# Patient Record
Sex: Female | Born: 1970 | Race: Black or African American | Hispanic: No | Marital: Single | Smoking: Current every day smoker
Health system: Southern US, Community
[De-identification: ages and names within clinical notes are randomized; demographics above are authoritative.]

## PROBLEM LIST (undated history)

## (undated) HISTORY — PX: TUBAL LIGATION: SHX77

---

## 1998-04-11 ENCOUNTER — Emergency Department (HOSPITAL_COMMUNITY): Admission: EM | Admit: 1998-04-11 | Discharge: 1998-04-11 | Payer: Self-pay | Admitting: Emergency Medicine

## 1998-05-23 ENCOUNTER — Emergency Department (HOSPITAL_COMMUNITY): Admission: EM | Admit: 1998-05-23 | Discharge: 1998-05-23 | Payer: Self-pay | Admitting: Emergency Medicine

## 1998-05-24 ENCOUNTER — Encounter: Payer: Self-pay | Admitting: Emergency Medicine

## 2000-02-10 ENCOUNTER — Emergency Department (HOSPITAL_COMMUNITY): Admission: EM | Admit: 2000-02-10 | Discharge: 2000-02-10 | Payer: Self-pay | Admitting: Emergency Medicine

## 2000-08-26 ENCOUNTER — Emergency Department (HOSPITAL_COMMUNITY): Admission: EM | Admit: 2000-08-26 | Discharge: 2000-08-26 | Payer: Self-pay | Admitting: Emergency Medicine

## 2001-07-28 ENCOUNTER — Emergency Department (HOSPITAL_COMMUNITY): Admission: EM | Admit: 2001-07-28 | Discharge: 2001-07-28 | Payer: Self-pay | Admitting: *Deleted

## 2003-01-23 ENCOUNTER — Emergency Department (HOSPITAL_COMMUNITY): Admission: EM | Admit: 2003-01-23 | Discharge: 2003-01-23 | Payer: Self-pay

## 2003-08-31 ENCOUNTER — Emergency Department (HOSPITAL_COMMUNITY): Admission: AD | Admit: 2003-08-31 | Discharge: 2003-08-31 | Payer: Self-pay | Admitting: Family Medicine

## 2003-09-29 ENCOUNTER — Emergency Department (HOSPITAL_COMMUNITY): Admission: EM | Admit: 2003-09-29 | Discharge: 2003-09-29 | Payer: Self-pay | Admitting: Emergency Medicine

## 2004-03-22 ENCOUNTER — Emergency Department (HOSPITAL_COMMUNITY): Admission: EM | Admit: 2004-03-22 | Discharge: 2004-03-22 | Payer: Self-pay | Admitting: Emergency Medicine

## 2005-02-04 ENCOUNTER — Emergency Department (HOSPITAL_COMMUNITY): Admission: EM | Admit: 2005-02-04 | Discharge: 2005-02-04 | Payer: Self-pay | Admitting: Emergency Medicine

## 2006-12-01 ENCOUNTER — Emergency Department (HOSPITAL_COMMUNITY): Admission: EM | Admit: 2006-12-01 | Discharge: 2006-12-01 | Payer: Self-pay | Admitting: Emergency Medicine

## 2007-08-20 ENCOUNTER — Emergency Department (HOSPITAL_COMMUNITY): Admission: EM | Admit: 2007-08-20 | Discharge: 2007-08-20 | Payer: Self-pay | Admitting: Family Medicine

## 2009-01-31 ENCOUNTER — Emergency Department (HOSPITAL_COMMUNITY): Admission: EM | Admit: 2009-01-31 | Discharge: 2009-01-31 | Payer: Self-pay | Admitting: Family Medicine

## 2009-11-04 ENCOUNTER — Emergency Department (HOSPITAL_COMMUNITY): Admission: EM | Admit: 2009-11-04 | Discharge: 2009-11-04 | Payer: Self-pay | Admitting: Emergency Medicine

## 2009-11-07 ENCOUNTER — Ambulatory Visit (HOSPITAL_COMMUNITY): Admission: RE | Admit: 2009-11-07 | Discharge: 2009-11-07 | Payer: Self-pay | Admitting: Obstetrics

## 2010-09-15 LAB — DIFFERENTIAL
Basophils Relative: 1 % (ref 0–1)
Eosinophils Absolute: 0.1 10*3/uL (ref 0.0–0.7)
Monocytes Absolute: 0.4 10*3/uL (ref 0.1–1.0)
Monocytes Relative: 9 % (ref 3–12)
Neutrophils Relative %: 37 % — ABNORMAL LOW (ref 43–77)

## 2010-09-15 LAB — CBC
MCHC: 34 g/dL (ref 30.0–36.0)
MCV: 84.9 fL (ref 78.0–100.0)
RBC: 4.68 MIL/uL (ref 3.87–5.11)

## 2011-05-12 IMAGING — CR DG HAND COMPLETE 3+V*R*
3 series · 3 of 3 positions shown · non-contrast
Comparison: None.

CLINICAL DATA: The patient woke up with soft tissue swelling in the
right hand.  Pain is in the vicinity of the thumb.

RIGHT HAND - COMPLETE 3+ VIEW

[view not recorded (1 of 3)]
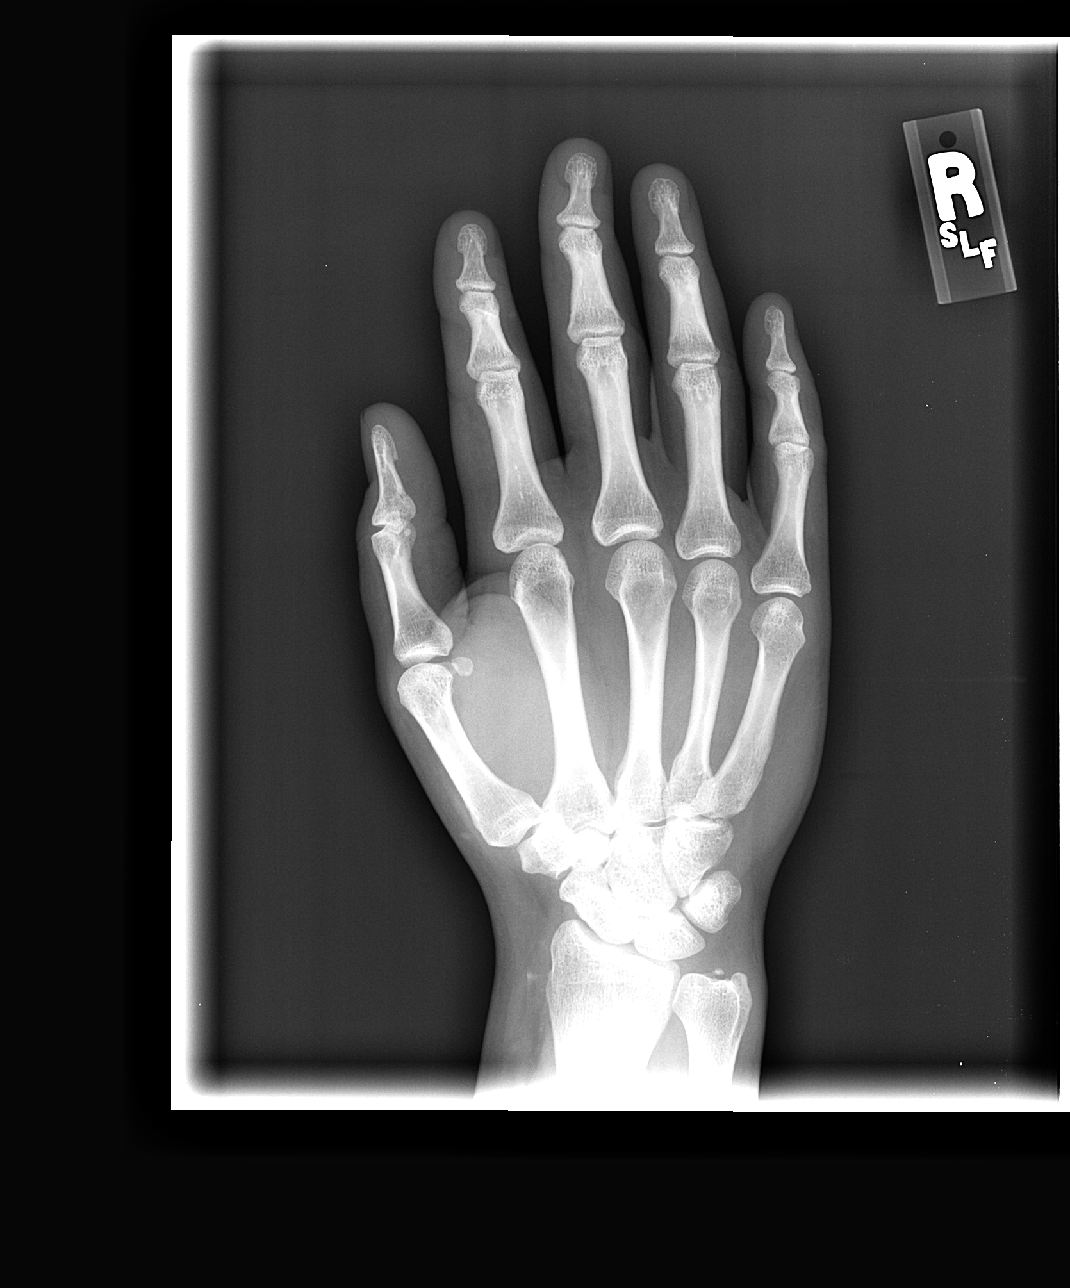

[view not recorded (2 of 3)]
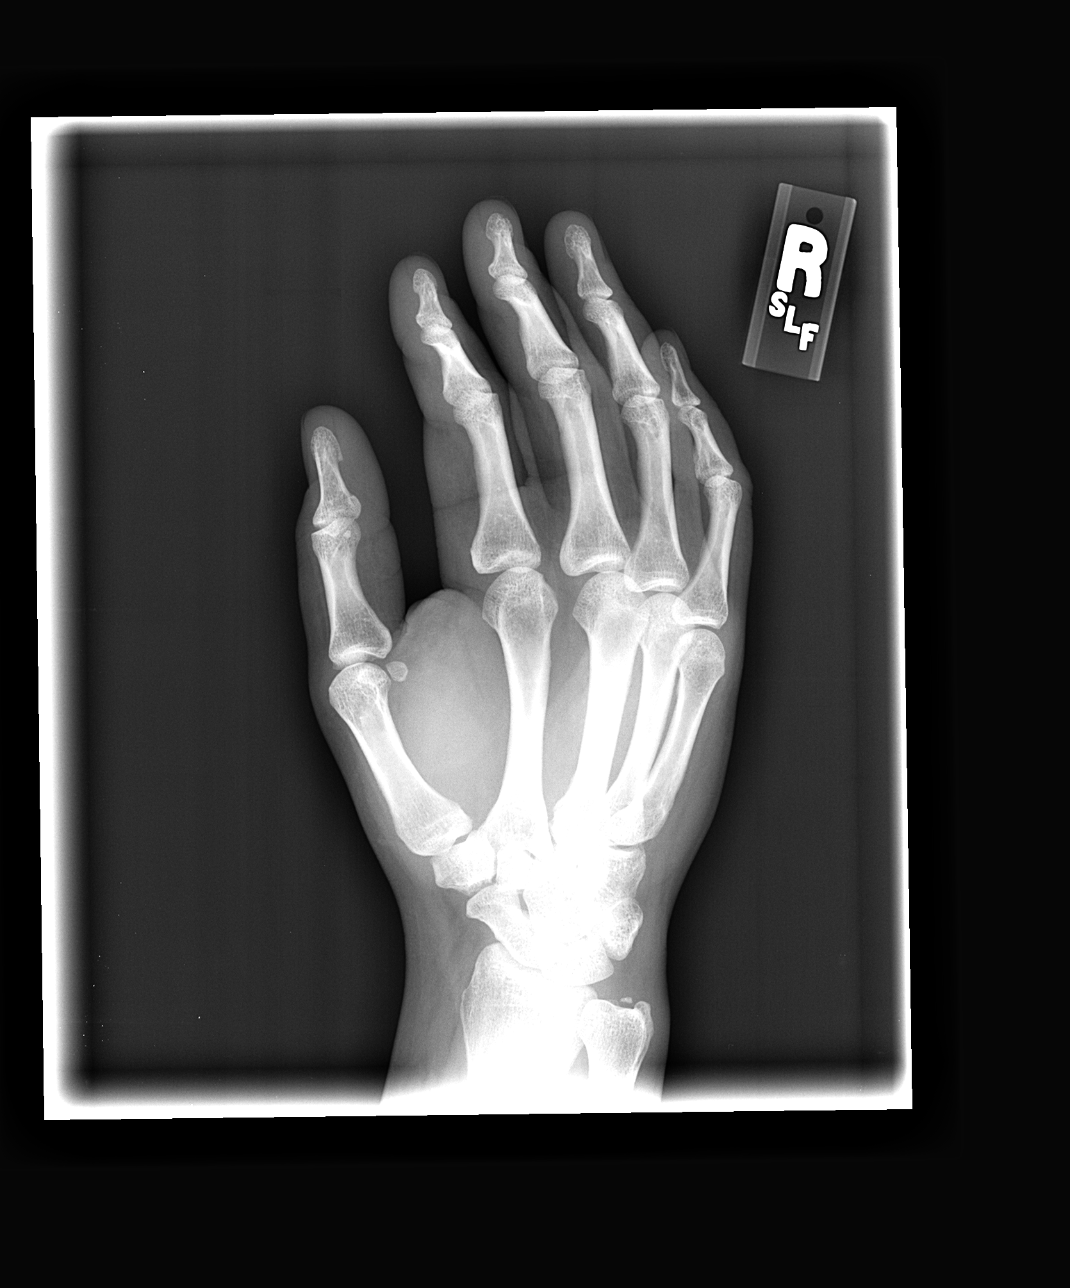

[view not recorded (3 of 3)]
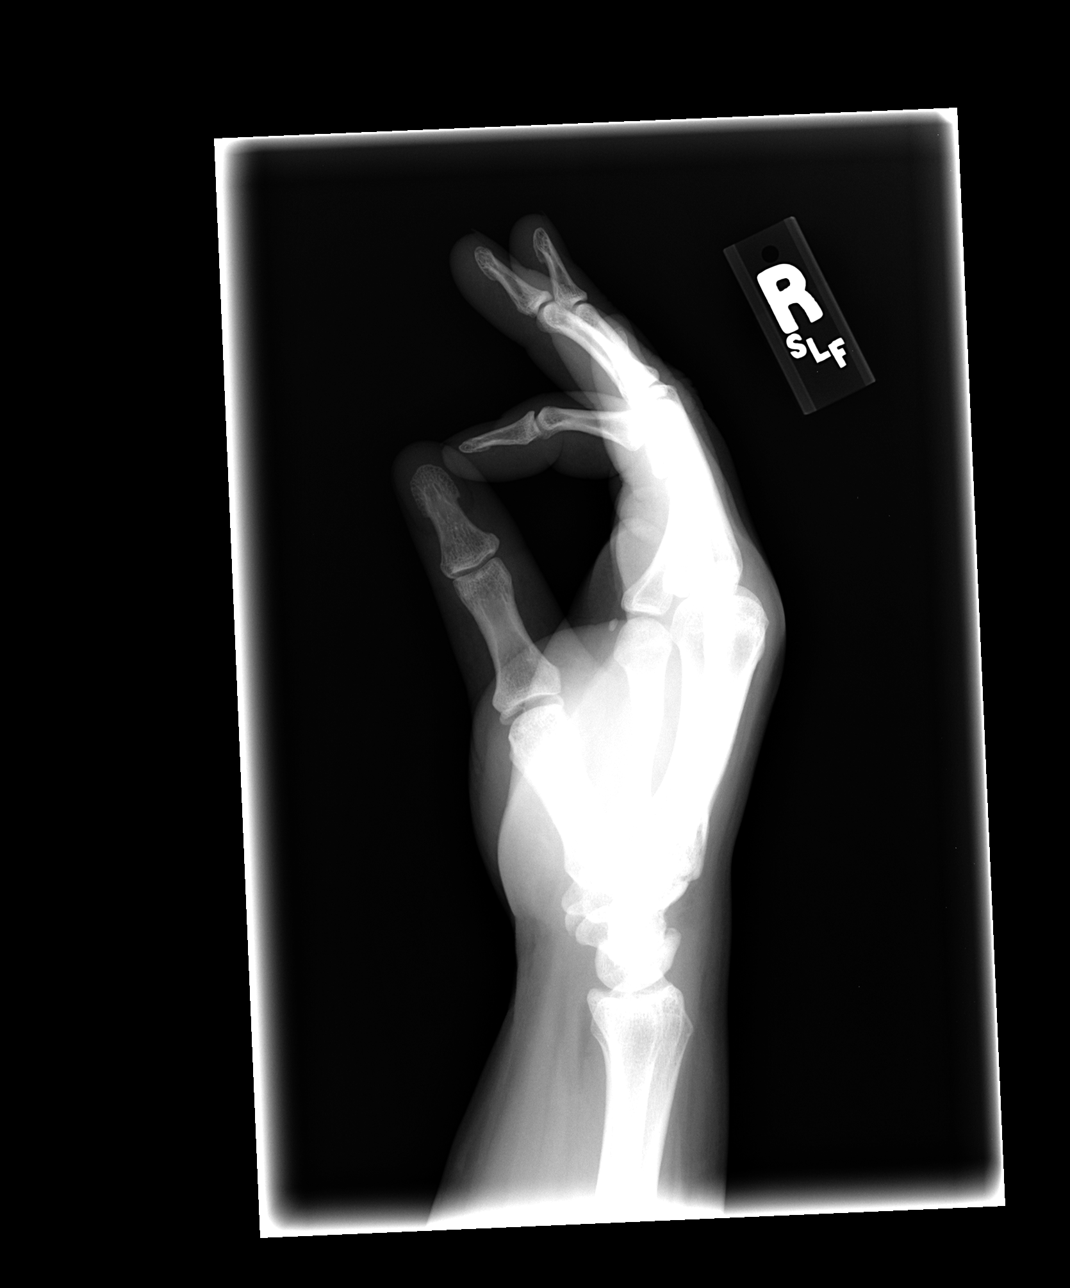

[3 of 3 positions shown; findings below may reference images not displayed]

FINDINGS: A 2 mm ossific structure projects just distal to the
distal ulnar articular surface, 3 mm medial to the ulnar styloid.
This could represent a free fragment from prior injury.  I doubt
that this is acute.  This appears ossific rather than representing
chondrocalcinosis in the triangular fibrocartilage complex.

The carpus appears otherwise unremarkable.  No metacarpal or
phalangeal acute bony findings identified.
IMPRESSION: 1.  Small ossific density just distal to the articular surface of
the ulna, potentially from remote injury.  I would doubt that this
is acute.

## 2011-11-18 ENCOUNTER — Emergency Department (INDEPENDENT_AMBULATORY_CARE_PROVIDER_SITE_OTHER)
Admission: EM | Admit: 2011-11-18 | Discharge: 2011-11-18 | Disposition: A | Discharge status: Home / Self Care | Payer: Self-pay | Source: Home / Self Care | Attending: Emergency Medicine | Admitting: Emergency Medicine

## 2011-11-18 ENCOUNTER — Encounter (HOSPITAL_COMMUNITY): Payer: Self-pay | Admitting: Emergency Medicine

## 2011-11-18 DIAGNOSIS — S39012A Strain of muscle, fascia and tendon of lower back, initial encounter: Secondary | ICD-10-CM

## 2011-11-18 DIAGNOSIS — S335XXA Sprain of ligaments of lumbar spine, initial encounter: Secondary | ICD-10-CM

## 2011-11-18 DIAGNOSIS — S46919A Strain of unspecified muscle, fascia and tendon at shoulder and upper arm level, unspecified arm, initial encounter: Secondary | ICD-10-CM

## 2011-11-18 DIAGNOSIS — IMO0002 Reserved for concepts with insufficient information to code with codable children: Secondary | ICD-10-CM

## 2011-11-18 MED ORDER — TRAMADOL HCL 50 MG PO TABS: 100.0000 mg | ORAL_TABLET | Freq: Three times a day (TID) | ORAL | Status: AC | PRN

## 2011-11-18 MED ORDER — MELOXICAM 15 MG PO TABS: 15.0000 mg | ORAL_TABLET | Freq: Every day | ORAL | Status: AC

## 2011-11-18 MED ORDER — METHOCARBAMOL 500 MG PO TABS: 500.0000 mg | ORAL_TABLET | Freq: Three times a day (TID) | ORAL | Status: AC
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
## 2011-11-18 NOTE — Discharge Instructions (Signed)
Back Exercises Back exercises help treat and prevent back injuries. The goal of back exercises is to increase the strength of your abdominal and back muscles and the flexibility of your back. These exercises should be started when you no longer have back pain. Back exercises include:  Pelvic Tilt. Lie on your back with your knees bent. Tilt your pelvis until the lower part of your back is against the floor. Hold this position 5 to 10 sec and repeat 5 to 10 times.   Knee to Chest. Pull first 1 knee up against your chest and hold for 20 to 30 seconds, repeat this with the other knee, and then both knees. This may be done with the other leg straight or bent, whichever feels better.   Sit-Ups or Curl-Ups. Bend your knees 90 degrees. Start with tilting your pelvis, and do a partial, slow sit-up, lifting your trunk only 30 to 45 degrees off the floor. Take at least 2 to 3 seconds for each sit-up. Do not do sit-ups with your knees out straight. If partial sit-ups are difficult, simply do the above but with only tightening your abdominal muscles and holding it as directed.   Hip-Lift. Lie on your back with your knees flexed 90 degrees. Push down with your feet and shoulders as you raise your hips a couple inches off the floor; hold for 10 seconds, repeat 5 to 10 times.   Back arches. Lie on your stomach, propping yourself up on bent elbows. Slowly press on your hands, causing an arch in your low back. Repeat 3 to 5 times. Any initial stiffness and discomfort should lessen with repetition over time.   Shoulder-Lifts. Lie face down with arms beside your body. Keep hips and torso pressed to floor as you slowly lift your head and shoulders off the floor.  Do not overdo your exercises, especially in the beginning. Exercises may cause you some mild back discomfort which lasts for a few minutes; however, if the pain is more severe, or lasts for more than 15 minutes, do not continue exercises until you see your  caregiver. Improvement with exercise therapy for back problems is slow.  See your caregivers for assistance with developing a proper back exercise program. Document Released: 07/22/2004 Document Revised: 06/03/2011 Document Reviewed: 06/14/2005 [employer] Patient Information 2012 [employer]. 
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
## 2013-04-03 ENCOUNTER — Encounter (HOSPITAL_COMMUNITY): Payer: Self-pay | Admitting: Emergency Medicine

## 2013-04-03 DIAGNOSIS — F17200 Nicotine dependence, unspecified, uncomplicated: Secondary | ICD-10-CM | POA: Insufficient documentation

## 2013-04-03 DIAGNOSIS — K6289 Other specified diseases of anus and rectum: Secondary | ICD-10-CM | POA: Insufficient documentation

## 2013-04-03 NOTE — ED Notes (Signed)
Pt. reports rectal pain with slight swelling for several days , denies injury / no bleeding . Pt. stated she tried Preparation H with no relief.

## 2013-04-04 ENCOUNTER — Emergency Department (HOSPITAL_COMMUNITY)
Admission: EM | Admit: 2013-04-04 | Discharge: 2013-04-04 | Discharge status: Against Medical Advice | Payer: Self-pay | Attending: Emergency Medicine | Admitting: Emergency Medicine

## 2013-04-04 ENCOUNTER — Emergency Department (HOSPITAL_COMMUNITY)
Admission: EM | Admit: 2013-04-04 | Discharge: 2013-04-04 | Disposition: A | Discharge status: Home / Self Care | Payer: Self-pay | Attending: Emergency Medicine | Admitting: Emergency Medicine

## 2013-04-04 ENCOUNTER — Encounter (HOSPITAL_COMMUNITY): Payer: Self-pay | Admitting: Emergency Medicine

## 2013-04-04 DIAGNOSIS — K6289 Other specified diseases of anus and rectum: Secondary | ICD-10-CM

## 2013-04-04 DIAGNOSIS — F17200 Nicotine dependence, unspecified, uncomplicated: Secondary | ICD-10-CM | POA: Insufficient documentation

## 2013-04-04 DIAGNOSIS — K649 Unspecified hemorrhoids: Secondary | ICD-10-CM

## 2013-04-04 DIAGNOSIS — Z79899 Other long term (current) drug therapy: Secondary | ICD-10-CM | POA: Insufficient documentation

## 2013-04-04 DIAGNOSIS — K648 Other hemorrhoids: Secondary | ICD-10-CM | POA: Insufficient documentation

## 2013-04-04 MED ORDER — HYDROCORTISONE 2.5 % RE CREA: TOPICAL_CREAM | RECTAL | Status: DC

## 2013-04-04 MED ORDER — POLYETHYLENE GLYCOL 3350 17 GM/SCOOP PO POWD: 17.0000 g | Freq: Two times a day (BID) | ORAL | Status: DC

## 2013-04-04 MED ORDER — HYDROCODONE-ACETAMINOPHEN 5-325 MG PO TABS: 2.0000 | ORAL_TABLET | ORAL | Status: DC | PRN

## 2013-04-04 NOTE — ED Notes (Signed)
See paper charting 

## 2013-04-04 NOTE — ED Notes (Signed)
Pt c/o rectal pain / hemorrhoid pain; pt was at [facility] this pm and left prior to being seen

## 2013-04-04 NOTE — ED Provider Notes (Signed)
Pt not seen She left before evaluation   Joya Gaskins, MD 04/04/13 [phone]

## 2013-04-04 NOTE — ED Provider Notes (Signed)
CSN: [id number]     Arrival date & time 04/04/13  4098 History   First MD Initiated Contact with Patient 04/04/13 0602     Chief Complaint  Patient presents with  . Rectal Pain   (Consider location/radiation/quality/duration/timing/severity/associated sxs/prior Treatment) HPI Comments: Patient is a 42 year old female with no significant past medical history who presents with rectal pain for the past week. Symptoms started gradually and progressively worsened since the onset. The pain is aching and severe without radiation. Palpation of the area and sitting makes the pain worse. No alleviating factors. Patient has not tried anything for symptoms. Patient reports recently starting to take iron supplements which have been causing constipation. No other associated symptoms.    History reviewed. No pertinent past medical history. Past Surgical History  Procedure Laterality Date  . Tubal ligation     No family history on file. History  Substance Use Topics  . Smoking status: Current Every Day Smoker -- 1.00 packs/day    Types: Cigarettes  . Smokeless tobacco: Not on file  . Alcohol Use: Yes     Comment: socially   OB History   Grav Para Term Preterm Abortions TAB SAB Ect Mult Living                 Review of Systems  Gastrointestinal: Positive for rectal pain.  All other systems reviewed and are negative.    Allergies  Review of patient's allergies indicates no known allergies.  Home Medications   Current Outpatient Rx  Name  Route  Sig  Dispense  Refill  . acetaminophen (TYLENOL) 325 MG tablet   Oral   Take 650 mg by mouth every 6 (six) hours as needed for pain (pain).         . IRON PO   Oral   Take 1 tablet by mouth daily.         . Multiple Vitamins-Minerals (MULTIVITAMIN WITH MINERALS) tablet   Oral   Take 1 tablet by mouth daily.          BP 146/80  Pulse 95  Temp(Src) 98.3 F (36.8 C) (Oral)  Resp 20  Ht 5\' 4"  (1.626 m)  Wt 210 lb (95.255 kg)  BMI  36.03 kg/m2  SpO2 98%  LMP 03/17/2013 Physical Exam  Nursing note and vitals reviewed. Constitutional: She is oriented to person, place, and time. She appears well-developed and well-nourished. No distress.  HENT:  Head: Normocephalic and atraumatic.  Eyes: Conjunctivae and EOM are normal.  Neck: Normal range of motion.  Cardiovascular: Normal rate and regular rhythm.  Exam reveals no gallop and no friction rub.   No murmur heard. Pulmonary/Chest: Effort normal and breath sounds normal. She has no wheezes. She has no rales. She exhibits no tenderness.  Abdominal: Soft. She exhibits no distension. There is no tenderness. There is no rebound and no guarding.  Genitourinary:  Internal hemorrhoid noted at the 10 o'clock position that is tender to palpation. Hemorrhoid is erythematous.   Musculoskeletal: Normal range of motion.  Neurological: She is alert and oriented to person, place, and time. Coordination normal.  Speech is goal-oriented. Moves limbs without ataxia.   Skin: Skin is warm and dry.  Psychiatric: She has a normal mood and affect. Her behavior is normal.    ED Course  Procedures (including critical care time) Labs Review Labs Reviewed - No data to display Imaging Review No results found.  MDM   1. Hemorrhoid     6:09 AM  Patient has an internal hemorrhoid at the 10 o'clock position visualized on exam. Patient will be discharged with pain medication, miralax and anusol. Vitals stable and patient afebrile. Hemorrhoid does not appear thrombosed. Patient instructed to return with worsening or concerning symptoms.   Emilia Beck, [place] 04/04/13 [phone]

## 2013-04-04 NOTE — ED Notes (Signed)
Pt alert, NAD, calm, interactive, resps e/u, speaking in clear complete sentences, continues to c/o rectal pain, (denies: nvd, fever, sob, dizziness, other pains or other sx), updated about wait.

## 2013-04-05 NOTE — ED Provider Notes (Signed)
Medical screening examination/treatment/procedure(s) were performed by non-physician practitioner and as supervising physician I was immediately available for consultation/collaboration.  Lyanne Co, MD 04/05/13 [phone]

## 2014-10-01 ENCOUNTER — Emergency Department (INDEPENDENT_AMBULATORY_CARE_PROVIDER_SITE_OTHER): Payer: 59

## 2014-10-01 ENCOUNTER — Emergency Department (INDEPENDENT_AMBULATORY_CARE_PROVIDER_SITE_OTHER)
Admission: EM | Admit: 2014-10-01 | Discharge: 2014-10-01 | Disposition: A | Discharge status: Home / Self Care | Payer: 59 | Source: Home / Self Care | Attending: Family Medicine | Admitting: Family Medicine

## 2014-10-01 ENCOUNTER — Encounter (HOSPITAL_COMMUNITY): Payer: Self-pay | Admitting: Emergency Medicine

## 2014-10-01 DIAGNOSIS — S86812A Strain of other muscle(s) and tendon(s) at lower leg level, left leg, initial encounter: Secondary | ICD-10-CM

## 2014-10-01 DIAGNOSIS — S86912A Strain of unspecified muscle(s) and tendon(s) at lower leg level, left leg, initial encounter: Secondary | ICD-10-CM

## 2014-10-01 MED ORDER — MELOXICAM 7.5 MG PO TABS: 7.5000 mg | ORAL_TABLET | Freq: Two times a day (BID) | ORAL | Status: DC

## 2014-10-01 NOTE — ED Provider Notes (Signed)
CSN: [id number]     Arrival date & time 10/01/14  1324 History   First MD Initiated Contact with Patient 10/01/14 1535     Chief Complaint  Patient presents with  . Knee Pain   (Consider location/radiation/quality/duration/timing/severity/associated sxs/prior Treatment) Patient is a 44 y.o. female presenting with knee pain. The history is provided by the patient.  Knee Pain Location:  Knee Time since incident:  4 days Injury: no   Knee location:  L knee Pain details:    Quality:  Sharp   Severity:  Moderate   Onset quality:  Gradual   Progression:  Worsening Chronicity:  New Dislocation: no   Relieved by:  Rest Worsened by:  Nothing tried Ineffective treatments:  Ice Associated symptoms: stiffness   Associated symptoms: no decreased ROM, no numbness, no swelling and no tingling     History reviewed. No pertinent past medical history. Past Surgical History  Procedure Laterality Date  . Tubal ligation     History reviewed. No pertinent family history. History  Substance Use Topics  . Smoking status: Current Every Day Smoker -- 1.00 packs/day    Types: Cigarettes  . Smokeless tobacco: Not on file  . Alcohol Use: Yes     Comment: socially   OB History    No data available     Review of Systems  Constitutional: Negative.   Musculoskeletal: Positive for gait problem and stiffness. Negative for joint swelling.  Skin: Negative.     Allergies  Review of patient's allergies indicates no known allergies.  Home Medications   Prior to Admission medications   Medication Sig Start Date End Date Taking? Authorizing Provider  acetaminophen (TYLENOL) 325 MG tablet Take 650 mg by mouth every 6 (six) hours as needed for pain (pain).    Historical Provider, MD  HYDROcodone-acetaminophen (NORCO/VICODIN) 5-325 MG per tablet Take 2 tablets by mouth every 4 (four) hours as needed for pain. 04/04/13   Kaitlyn Szekalski, PA-C  hydrocortisone (ANUSOL-HC) 2.5 % rectal cream Apply  rectally 2 times daily 04/04/13   Emilia Beck, PA-C  IRON PO Take 1 tablet by mouth daily.    Historical Provider, MD  meloxicam (MOBIC) 7.5 MG tablet Take 1 tablet (7.5 mg total) by mouth 2 (two) times daily after a meal. 10/01/14   Linna Hoff, MD  Multiple Vitamins-Minerals (MULTIVITAMIN WITH MINERALS) tablet Take 1 tablet by mouth daily.    Historical Provider, MD  polyethylene glycol powder (GLYCOLAX/MIRALAX) powder Take 17 g by mouth 2 (two) times daily. Until daily soft stools  OTC 04/04/13   Kaitlyn Szekalski, PA-C   BP 120/81 mmHg  Pulse 72  Temp(Src) 98.3 F (36.8 C) (Oral)  Resp 16  SpO2 98%  LMP 09/23/2014 (Exact Date) Physical Exam  Constitutional: She is oriented to person, place, and time. She appears well-nourished.  Musculoskeletal: She exhibits tenderness.       Left knee: She exhibits decreased range of motion. She exhibits no swelling, no effusion, normal alignment, no LCL laxity, normal patellar mobility and no MCL laxity. Tenderness found. MCL tenderness noted. No medial joint line and no lateral joint line tenderness noted.       Legs: Neurological: She is alert and oriented to person, place, and time.  Skin: Skin is warm.  Nursing note and vitals reviewed.   ED Course  Procedures (including critical care time) Labs Review Labs Reviewed - No data to display  Imaging Review Dg Knee Complete 4 Views Left  10/01/2014   CLINICAL  DATA:  Pain and swelling since Friday, no injury  EXAM: LEFT KNEE - COMPLETE 4+ VIEW  COMPARISON:  01/31/2009  FINDINGS: Osseous mineralization normal.  Minimal joint space narrowing.  Tiny articular margin patellar spurs.  No acute fracture, dislocation, or bone destruction.  No knee joint effusion or definite regional soft tissue abnormalities.  IMPRESSION: Mild degenerative changes.  No acute abnormalities.   Electronically Signed   By: Ulyses SouthwardMark  Boles M.D.   On: 10/01/2014 16:23     MDM   1. Strain of knee, left, initial encounter          Linna HoffJames D Kindl, MD 10/01/14 1714

## 2014-10-01 NOTE — ED Notes (Signed)
Pt has been suffering from left medial knee pain since Friday.  She states she did not sustain any fall or injury to the knee.

## 2014-10-01 NOTE — Discharge Instructions (Signed)
Ice and medicine and brace as needed for soreness, see orthopedist if further problems.

## 2016-04-07 IMAGING — DX DG KNEE COMPLETE 4+V*L*
4 series · 4 of 4 positions shown · non-contrast
Comparison: 01/31/2009

CLINICAL DATA: Pain and swelling since [REDACTED], no injury

EXAM:
LEFT KNEE - COMPLETE 4+ VIEW

[knee ap]
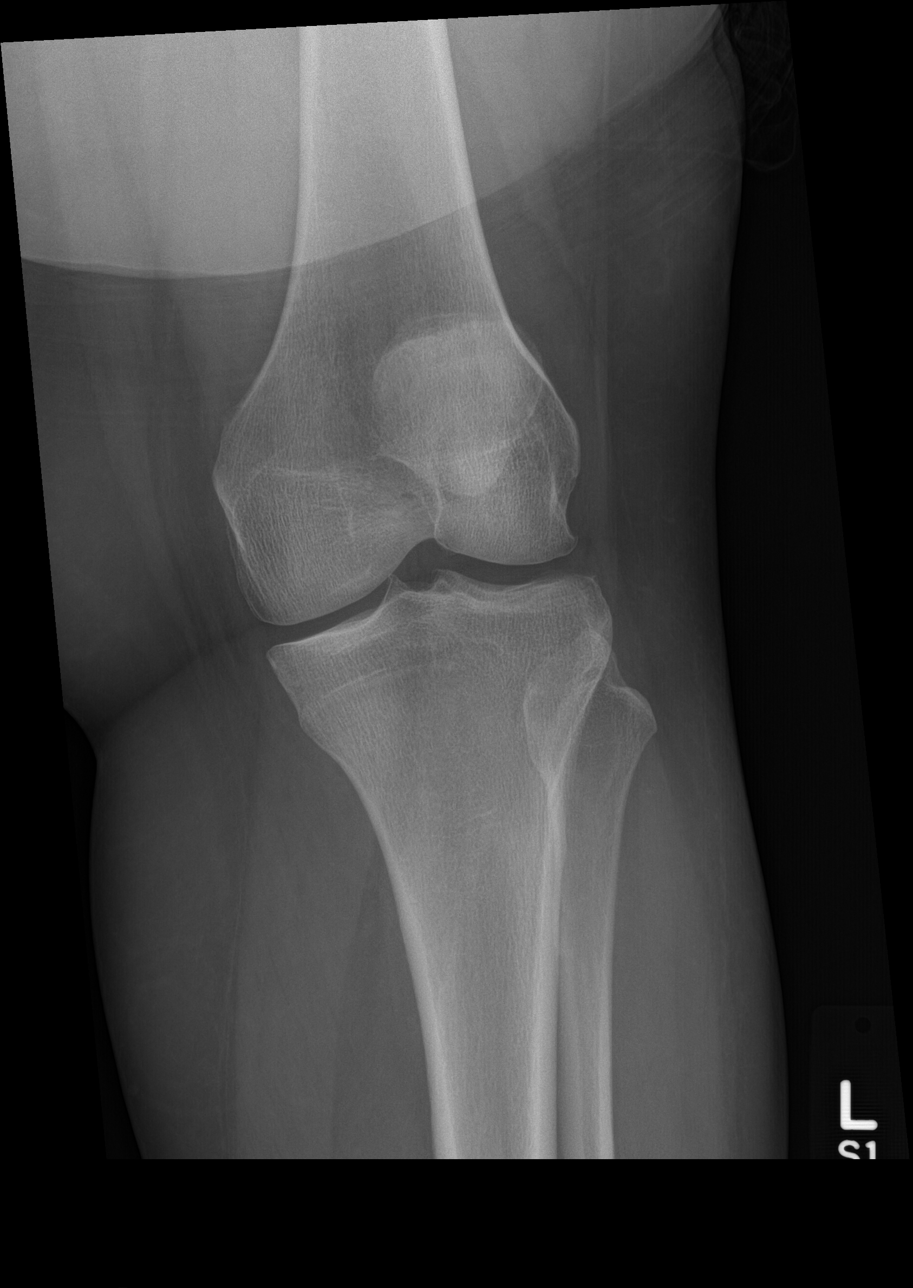

[knee obl]
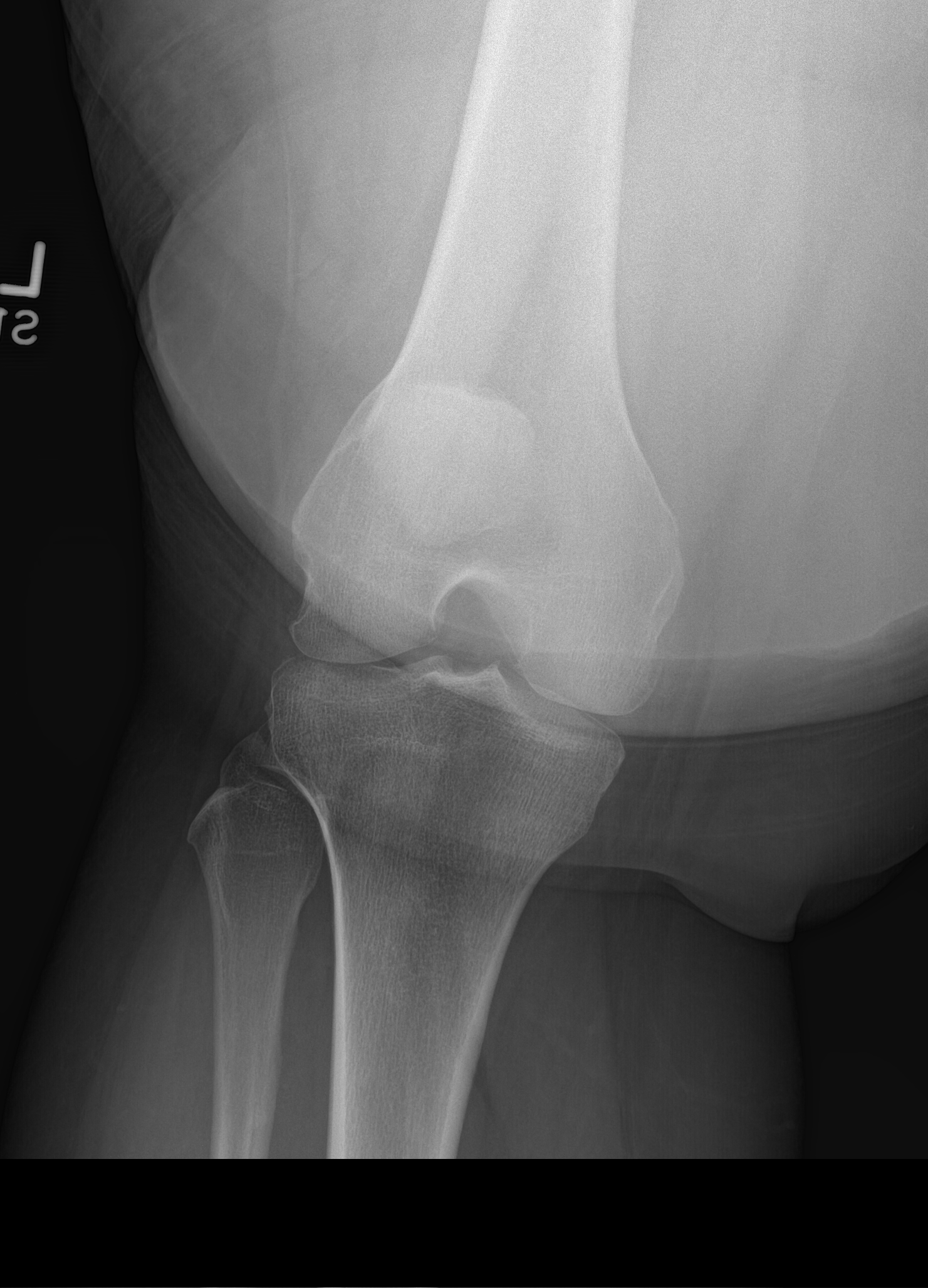

[knee lat]
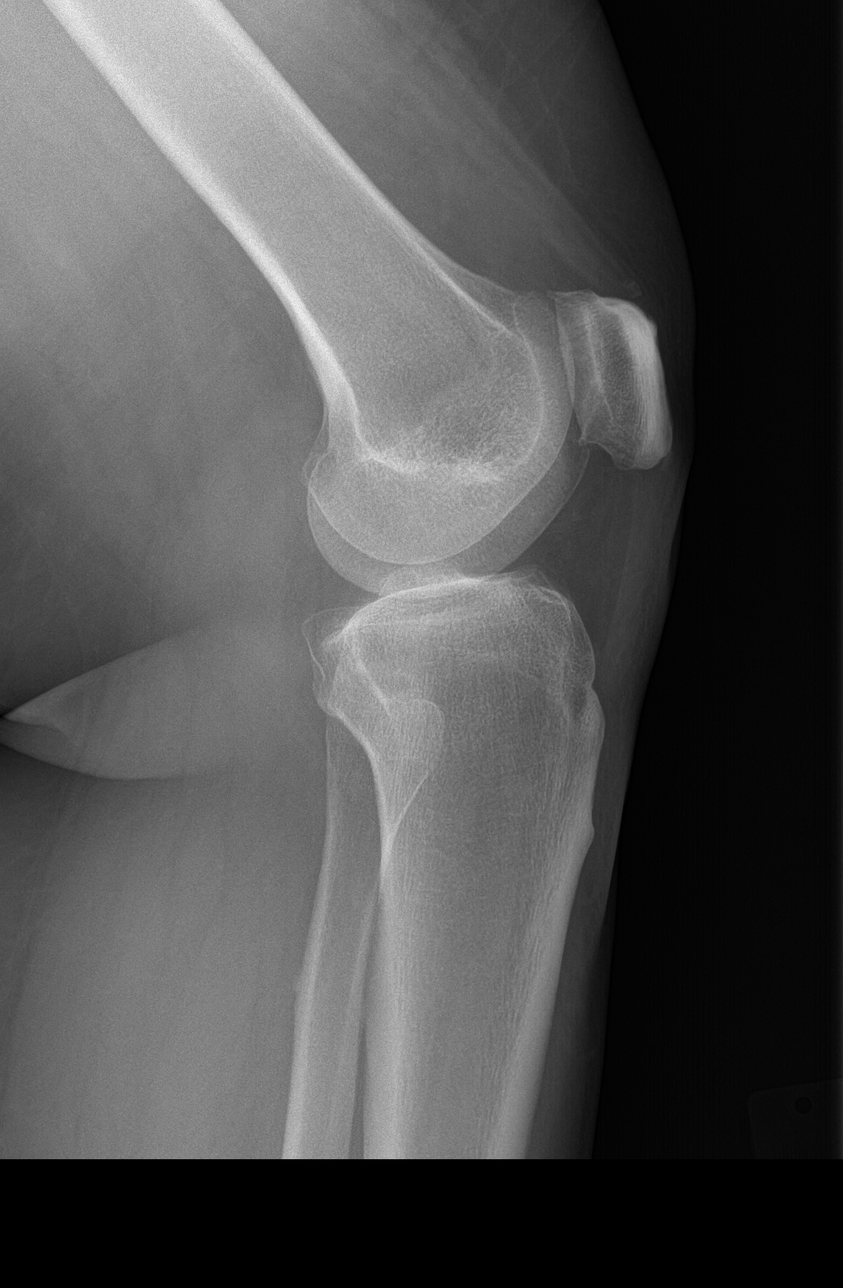

[patella skyline]
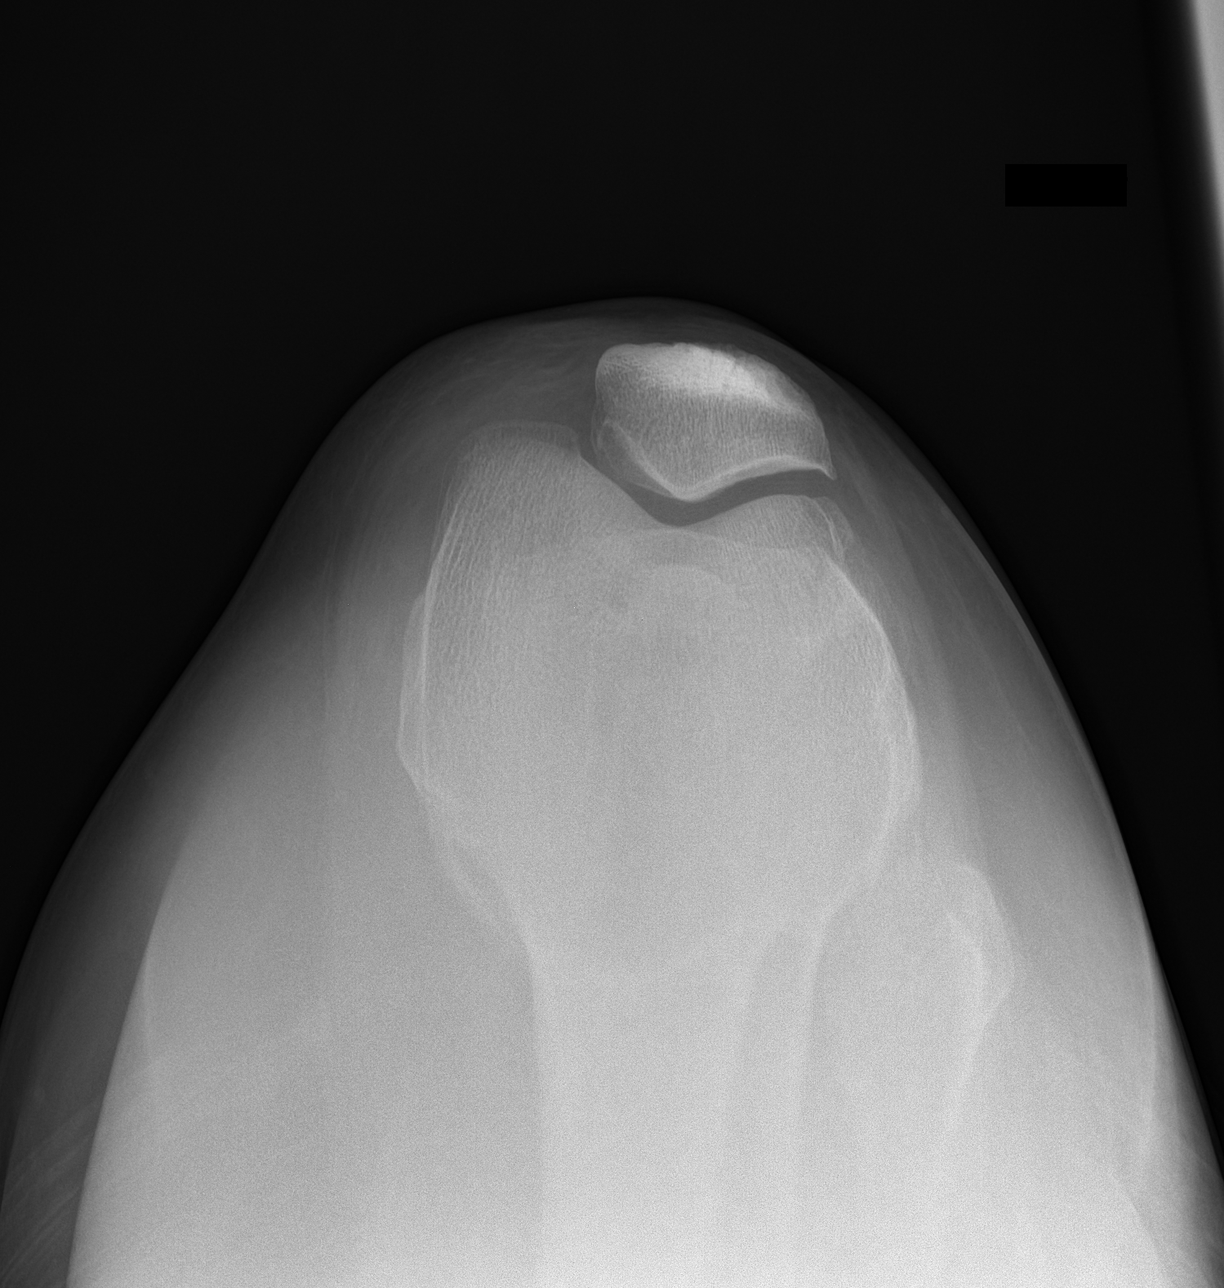

[4 of 4 positions shown; findings below may reference images not displayed]

FINDINGS: Osseous mineralization normal.

Minimal joint space narrowing.

Tiny articular margin patellar spurs.

No acute fracture, dislocation, or bone destruction.

No knee joint effusion or definite regional soft tissue
abnormalities.
IMPRESSION: Mild degenerative changes.

No acute abnormalities.

## 2016-05-25 ENCOUNTER — Encounter (HOSPITAL_COMMUNITY): Payer: Self-pay

## 2016-05-25 ENCOUNTER — Emergency Department (HOSPITAL_COMMUNITY)
Admission: EM | Admit: 2016-05-25 | Discharge: 2016-05-25 | Disposition: A | Discharge status: Home / Self Care | Payer: 59 | Attending: Emergency Medicine | Admitting: Emergency Medicine

## 2016-05-25 DIAGNOSIS — F17210 Nicotine dependence, cigarettes, uncomplicated: Secondary | ICD-10-CM | POA: Insufficient documentation

## 2016-05-25 DIAGNOSIS — K644 Residual hemorrhoidal skin tags: Secondary | ICD-10-CM | POA: Insufficient documentation

## 2016-05-25 MED ORDER — ACETAMINOPHEN 500 MG PO TABS
1000.0000 mg | ORAL_TABLET | Freq: Once | ORAL | Status: AC
  Administered 2016-05-25: 1000 mg via ORAL
  Filled 2016-05-25: qty 2

## 2016-05-25 MED ORDER — TRAMADOL HCL 50 MG PO TABS: 50.0000 mg | ORAL_TABLET | Freq: Two times a day (BID) | ORAL | 0 refills | Status: AC | PRN

## 2016-05-25 MED ORDER — KETOROLAC TROMETHAMINE 60 MG/2ML IM SOLN
60.0000 mg | Freq: Once | INTRAMUSCULAR | Status: AC
  Administered 2016-05-25: 60 mg via INTRAMUSCULAR
  Filled 2016-05-25: qty 2

## 2016-05-25 NOTE — ED Provider Notes (Signed)
[facility]-EMERGENCY DEPT Provider Note   CSN: [id number] Arrival date & time: 05/25/16  [id number]   By signing my name below, I, Clarisse GougeXavier Herndon, attest that this documentation has been prepared under the direction and in the presence of Tomasita CrumbleAdeleke Mariadelaluz Guggenheim, MD. Electronically signed, Clarisse GougeXavier Herndon, ED Scribe. 05/25/16. 3:39 AM.   History   Chief Complaint Chief Complaint  Patient presents with  . Hemorrhoids   The history is provided by the patient. No language interpreter was used.    HPI Comments: Jessica Wright is a 45 y.o. female who presents to the Emergency Department complaining of severe hemorrhoid pain x 4 days. She notes PMHx of hemorrhoids . Pt reports using suppositories and Preparation H with no relief. She denies blood in stool, constipation, and urinary symptoms.  There has been no rectal bleeding. There are no further complaints.  History reviewed. No pertinent past medical history.  There are no active problems to display for this patient.   Past Surgical History:  Procedure Laterality Date  . TUBAL LIGATION      OB History    No data available       Home Medications    Prior to Admission medications   Medication Sig Start Date End Date Taking? Authorizing Provider  acetaminophen (TYLENOL) 325 MG tablet Take 650 mg by mouth every 6 (six) hours as needed for pain (pain).    Historical Provider, MD  HYDROcodone-acetaminophen (NORCO/VICODIN) 5-325 MG per tablet Take 2 tablets by mouth every 4 (four) hours as needed for pain. 04/04/13   Kaitlyn Szekalski, PA-C  hydrocortisone (ANUSOL-HC) 2.5 % rectal cream Apply rectally 2 times daily 04/04/13   Emilia BeckKaitlyn Szekalski, PA-C  IRON PO Take 1 tablet by mouth daily.    Historical Provider, MD  meloxicam (MOBIC) 7.5 MG tablet Take 1 tablet (7.5 mg total) by mouth 2 (two) times daily after a meal. 10/01/14   Linna HoffJames D Kindl, MD  Multiple Vitamins-Minerals (MULTIVITAMIN WITH MINERALS) tablet Take 1 tablet by mouth daily.    Historical Provider,  MD  polyethylene glycol powder (GLYCOLAX/MIRALAX) powder Take 17 g by mouth 2 (two) times daily. Until daily soft stools  OTC 04/04/13   Emilia BeckKaitlyn Szekalski, PA-C    Family History History reviewed. No pertinent family history.  Social History Social History  Substance Use Topics  . Smoking status: Current Every Day Smoker    Packs/day: 1.00    Types: Cigarettes  . Smokeless tobacco: Never Used  . Alcohol use Yes     Comment: socially     Allergies   Patient has no known allergies.   Review of Systems Review of Systems 10 Systems reviewed and all are negative for acute change except as noted in the HPI.    Physical Exam Updated Vital Signs BP 133/77 (BP Location: Right Arm)   Pulse 100   Temp 98.4 F (36.9 C) (Oral)   Resp 20   Ht 5\' 4"  (1.626 m)   Wt 265 lb (120.2 kg)   LMP 05/04/2016   SpO2 98%   BMI 45.49 kg/m   Physical Exam  Constitutional: She is oriented to person, place, and time. She appears well-developed and well-nourished. No distress.  HENT:  Head: Normocephalic and atraumatic.  Nose: Nose normal.  Mouth/Throat: Oropharynx is clear and moist. No oropharyngeal exudate.  Eyes: Conjunctivae and EOM are normal. Pupils are equal, round, and reactive to light. No scleral icterus.  Neck: Normal range of motion. Neck supple. No JVD present. No tracheal deviation present.  No thyromegaly present.  Cardiovascular: Normal rate, regular rhythm and normal heart sounds.  Exam reveals no gallop and no friction rub.   No murmur heard. Pulmonary/Chest: Effort normal and breath sounds normal. No respiratory distress. She has no wheezes. She exhibits no tenderness.  Abdominal: Soft. Bowel sounds are normal. She exhibits no distension and no mass. There is no tenderness. There is no rebound and no guarding.  Genitourinary:  Genitourinary Comments: External hemorrhoid at the 11 o'clock position.  Musculoskeletal: Normal range of motion. She exhibits no edema or  tenderness.  Lymphadenopathy:    She has no cervical adenopathy.  Neurological: She is alert and oriented to person, place, and time. No cranial nerve deficit. She exhibits normal muscle tone.  Skin: Skin is warm and dry. No rash noted. No erythema. No pallor.  Nursing note and vitals reviewed.    ED Treatments / Results  DIAGNOSTIC STUDIES: Oxygen Saturation is 98% on RA, normal by my interpretation.    COORDINATION OF CARE: 3:39 AM Discussed treatment plan with pt at bedside and pt agreed to plan.  Labs (all labs ordered are listed, but only abnormal results are displayed) Labs Reviewed - No data to display  EKG  EKG Interpretation None       Radiology No results found.  Procedures Procedures (including critical care time)  Medications Ordered in ED Medications - No data to display   Initial Impression / Assessment and Plan / ED Course  I have reviewed the triage vital signs and the nursing notes.  Pertinent labs & imaging results that were available during my care of the patient were reviewed by me and considered in my medical decision making (see chart for details).  Clinical Course     Patient presents to the ED for hemorrhoids.  Her exam shows a small external hemorrhoid that is not thrombosed.  She was educated on home care, advised on sitz baths and stool softeners (although she denies any constipation).  Tylenol and ibuprofen for pain andwill write Rx for tramadol for breakthough pain.  She was given toradol and tylenol in the ED.  She appears well and in NAD.  VS remain within her normal limits and she is safe for DC.  Final Clinical Impressions(s) / ED Diagnoses   Final diagnoses:  None    New Prescriptions New Prescriptions   No medications on file    I personally performed the services described in this documentation, which was scribed in my presence. The recorded information has been reviewed and is accurate.      Tomasita CrumbleAdeleke Shirly Bartosiewicz, MD 05/25/16  [phone]

## 2016-05-25 NOTE — ED Triage Notes (Signed)
Pt complains of hemorrhoid pain for three days

## 2021-03-30 ENCOUNTER — Ambulatory Visit (INDEPENDENT_AMBULATORY_CARE_PROVIDER_SITE_OTHER): Payer: Self-pay

## 2021-03-30 ENCOUNTER — Other Ambulatory Visit: Payer: Self-pay

## 2021-03-30 ENCOUNTER — Ambulatory Visit (HOSPITAL_COMMUNITY)
Admission: EM | Admit: 2021-03-30 | Discharge: 2021-03-30 | Disposition: A | Discharge status: Home / Self Care | Payer: Self-pay | Attending: Physician Assistant | Admitting: Physician Assistant

## 2021-03-30 ENCOUNTER — Encounter (HOSPITAL_COMMUNITY): Payer: Self-pay

## 2021-03-30 DIAGNOSIS — M79672 Pain in left foot: Secondary | ICD-10-CM

## 2021-03-30 MED ORDER — NAPROXEN 500 MG PO TABS: 500.0000 mg | ORAL_TABLET | Freq: Two times a day (BID) | ORAL | 0 refills | Status: AC

## 2021-03-30 NOTE — ED Provider Notes (Signed)
[facility]    CSN: [id number] Arrival date & time: 03/30/21  6010      History   Chief Complaint Chief Complaint  Patient presents with   Foot Pain    HPI Jessica Wright is a 50 y.o. female.   Patient presents today with a 1 day history of worsening left foot pain.  She reports having mild pain for the past week but over the course of the last 24 hours she has had significant worsening of pain which is currently rated 10 on a 0-10 pain scale, localized to dorsal left foot, described as throbbing with periodic sharp pains, worse with attempted ambulation or palpation, no alleviating factors identified.  She denies any known injury or increase in activity prior to symptom onset.  Denies any recent motor vehicle collisions.  She has tried over-the-counter analgesics without improvement of symptoms.  She denies any numbness or tingling in foot.  She does report some new footwear but is unsure if this is contributing to symptoms.  She does not see a podiatrist.  Denies history of gout or arthritis.  She is confident that she is not pregnant   History reviewed. No pertinent past medical history.  There are no problems to display for this patient.   Past Surgical History:  Procedure Laterality Date   TUBAL LIGATION      OB History   No obstetric history on file.      Home Medications    Prior to Admission medications   Medication Sig Start Date End Date Taking? Authorizing Provider  naproxen (NAPROSYN) 500 MG tablet Take 1 tablet (500 mg total) by mouth 2 (two) times daily. 03/30/21  Yes Ebert Forrester K, PA-C  Multiple Vitamins-Minerals (ADULT GUMMY) CHEW Chew 2 each by mouth daily.    [provider]  Multiple Vitamins-Minerals (ONE-A-DAY WOMENS 50+ ADVANTAGE) TABS Take 1 tablet by mouth daily.    [provider]  Omega-3 Fatty Acids (FISH OIL PO) Take 1 capsule by mouth daily.    [provider]  traMADol (ULTRAM) 50 MG tablet Take 1 tablet  (50 mg total) by mouth every 12 (twelve) hours as needed for severe pain. 05/25/16   Tomasita Crumble, MD    Family History Family History  Family history unknown: Yes    Social History Social History   Tobacco Use   Smoking status: Every Day    Packs/day: 1.00    Types: Cigarettes   Smokeless tobacco: Never  Substance Use Topics   Alcohol use: Yes    Comment: socially   Drug use: No     Allergies   Patient has no known allergies.   Review of Systems Review of Systems  Constitutional:  Positive for activity change. Negative for appetite change, fatigue and fever.  Respiratory:  Negative for cough and shortness of breath.   Cardiovascular:  Negative for chest pain.  Musculoskeletal:  Positive for arthralgias and gait problem. Negative for joint swelling and myalgias.  Neurological:  Negative for dizziness, weakness, light-headedness, numbness and headaches.    Physical Exam Triage Vital Signs ED Triage Vitals  Enc Vitals Group     BP 03/30/21 1109 137/84     Pulse Rate 03/30/21 1109 71     Resp 03/30/21 1109 18     Temp 03/30/21 1109 98.5 F (36.9 C)     Temp Source 03/30/21 1109 Oral     SpO2 03/30/21 1109 98 %     Weight --  Height --      Head Circumference --      Peak Flow --      Pain Score 03/30/21 1107 10     Pain Loc --      Pain Edu? --      Excl. in GC? --    No data found.  Updated Vital Signs BP 137/84 (BP Location: Left Arm)   Pulse 71   Temp 98.5 F (36.9 C) (Oral)   Resp 18   LMP 03/23/2021 (Approximate)   SpO2 98%   Visual Acuity Right Eye Distance:   Left Eye Distance:   Bilateral Distance:    Right Eye Near:   Left Eye Near:    Bilateral Near:     Physical Exam Vitals reviewed.  Constitutional:      General: She is awake. She is not in acute distress.    Appearance: Normal appearance. She is well-developed. She is not ill-appearing.     Comments: Very pleasant female appears stated age in no acute distress sitting  comfortably in exam room  HENT:     Head: Normocephalic and atraumatic.  Cardiovascular:     Rate and Rhythm: Normal rate and regular rhythm.     Pulses:          Posterior tibial pulses are 2+ on the right side and 2+ on the left side.     Heart sounds: Normal heart sounds, S1 normal and S2 normal. No murmur heard.    Comments: Capillary refill within 2 seconds left toes Pulmonary:     Effort: Pulmonary effort is normal.     Breath sounds: Normal breath sounds. No wheezing, rhonchi or rales.     Comments: Clear to auscultation bilaterally Musculoskeletal:     Left foot: Normal range of motion.       Feet:  Feet:     Left foot:     Protective Sensation: 10 sites tested.  10 sites sensed.     Skin integrity: No ulcer, blister or skin breakdown.     Comments: Tenderness to Palpation over dorsal metatarsals.  No deformity noted.  Antalgic gait.  Foot neurovascularly intact. Psychiatric:        Behavior: Behavior is cooperative.     UC Treatments / Results  Labs (all labs ordered are listed, but only abnormal results are displayed) Labs Reviewed - No data to display  EKG   Radiology DG Foot Complete Left  Result Date: 03/30/2021 CLINICAL DATA:  Left foot pain EXAM: LEFT FOOT - COMPLETE 3 VIEW COMPARISON:  None. FINDINGS: There is no evidence of fracture or dislocation. Mild degenerative changes of the midfoot and first MTP joint. Small calcaneal spur and Achilles tendon enthesophyte. Soft tissues are unremarkable. IMPRESSION: No acute osseous abnormality. Electronically Signed   By: Allegra Lai M.D.   On: 03/30/2021 11:53    Procedures Procedures (including critical care time)  Medications Ordered in UC Medications - No data to display  Initial Impression / Assessment and Plan / UC Course  I have reviewed the triage vital signs and the nursing notes.  Pertinent labs & imaging results that were available during my care of the patient were reviewed by me and considered  in my medical decision making (see chart for details).      X-ray obtained given severity of pain and bony tenderness on exam showed no acute abnormalities or stress fractures.  Patient was placed in postop shoe for comfort.  Recommended conservative treatment measures including  elevation and ice for symptom relief.  She was prescribed Naprosyn twice daily with instruction not to take NSAIDs with this medication due to risk of GI bleeding.  She can use Tylenol for breakthrough pain.  Discussed that if symptoms are not improving with conservative treatment measures she should follow-up with podiatrist and was given contact information for local provider if symptoms persist.  Discussed alarm symptoms that warrant emergent evaluation.  Strict return precautions given to which she expressed understanding.  Final Clinical Impressions(s) / UC Diagnoses   Final diagnoses:  Left foot pain     Discharge Instructions      Your x-ray was normal.  Please use postop shoe to help with pain.  Keep your foot elevated and use ice for additional symptom relief.  I have called in a anti-inflammatory medication known as Naprosyn that he should take twice daily.  Do not take additional NSAIDs including aspirin, ibuprofen/Advil, naproxen/Aleve with this medication as can cause stomach bleeding.  You can use Tylenol for additional symptom relief.  If your symptoms are not improving with these measures please follow-up with podiatry as we discussed.     ED Prescriptions     Medication Sig Dispense Auth. Provider   naproxen (NAPROSYN) 500 MG tablet Take 1 tablet (500 mg total) by mouth 2 (two) times daily. 30 tablet Ryanne Morand, Noberto Retort, PA-C      PDMP not reviewed this encounter.   Jeani Hawking, PA-C 03/30/21 1202

## 2021-03-30 NOTE — ED Triage Notes (Signed)
Pt presents with c/o L foot pain that started this morning. Pt denies injury and states she has not been an MVC. States she does not know how the pain started. States she did recently buy new shoes and is not sure if that is what has caused the pain.

## 2021-03-30 NOTE — Discharge Instructions (Addendum)
Your x-ray was normal.  Please use postop shoe to help with pain.  Keep your foot elevated and use ice for additional symptom relief.  I have called in a anti-inflammatory medication known as Naprosyn that he should take twice daily.  Do not take additional NSAIDs including aspirin, ibuprofen/Advil, naproxen/Aleve with this medication as can cause stomach bleeding.  You can use Tylenol for additional symptom relief.  If your symptoms are not improving with these measures please follow-up with podiatry as we discussed.

## 2022-01-30 ENCOUNTER — Emergency Department (HOSPITAL_COMMUNITY)
Admission: EM | Admit: 2022-01-30 | Discharge: 2022-01-31 | Disposition: A | Discharge status: Home / Self Care | Payer: Self-pay | Attending: Emergency Medicine | Admitting: Emergency Medicine

## 2022-01-30 ENCOUNTER — Other Ambulatory Visit: Payer: Self-pay

## 2022-01-30 DIAGNOSIS — M79671 Pain in right foot: Secondary | ICD-10-CM | POA: Insufficient documentation

## 2022-01-30 DIAGNOSIS — M79672 Pain in left foot: Secondary | ICD-10-CM | POA: Insufficient documentation

## 2022-01-30 DIAGNOSIS — M7989 Other specified soft tissue disorders: Secondary | ICD-10-CM | POA: Insufficient documentation

## 2022-01-30 NOTE — ED Triage Notes (Signed)
Pt presents with bilateral foot pain x months. States she is on her feet all day at work.

## 2022-01-31 MED ORDER — PREDNISONE 20 MG PO TABS: 40.0000 mg | ORAL_TABLET | Freq: Every day | ORAL | 0 refills | Status: AC

## 2022-01-31 NOTE — Progress Notes (Signed)
Orthopedic Tech Progress Note Patient Details:  Jessica Wright Jan 22, 1971 [id number]  Ortho Devices Type of Ortho Device: Postop shoe/boot Ortho Device/Splint Location: rle Ortho Device/Splint Interventions: Ordered, Application, Adjustment   Post Interventions Patient Tolerated: Well Instructions Provided: Care of device, Adjustment of device  Trinna Post 01/31/2022, 1:47 AM

## 2022-01-31 NOTE — Discharge Instructions (Addendum)
We recommend use of a post op shoe for stability and comfort. Take prednisone as prescribed until finished. We recommend follow up with podiatry.

## 2022-01-31 NOTE — ED Provider Notes (Signed)
[facility] EMERGENCY DEPARTMENT Provider Note   CSN: [id number] Arrival date & time: 01/30/22  2136     History  Chief Complaint  Patient presents with   Bilateral foot pain    Jessica Wright is a 51 y.o. female.  51 year old female presents to the emergency department for evaluation of bilateral foot pain.  She states that symptoms have been present for the past 3 months.  Pain is worse with weightbearing, ambulation.  Initially reports that symptoms were present in her left foot, but these have slightly improved and now her right foot pain is worse.  Has tried over-the-counter analgesics including Tylenol and ibuprofen without relief.  Denies any trauma or injury, but does stand for long hours at her job.  She works at [employer].  No associated fevers.  The history is provided by the patient. No language interpreter was used.       Home Medications Prior to Admission medications   Medication Sig Start Date End Date Taking? Authorizing Provider  predniSONE (DELTASONE) 20 MG tablet Take 2 tablets (40 mg total) by mouth daily. 01/31/22  Yes Antony Madura, PA-C  Multiple Vitamins-Minerals (ADULT GUMMY) CHEW Chew 2 each by mouth daily.    [provider]  Multiple Vitamins-Minerals (ONE-A-DAY WOMENS 50+ ADVANTAGE) TABS Take 1 tablet by mouth daily.    [provider]  naproxen (NAPROSYN) 500 MG tablet Take 1 tablet (500 mg total) by mouth 2 (two) times daily. 03/30/21   Raspet, Noberto Retort, PA-C  Omega-3 Fatty Acids (FISH OIL PO) Take 1 capsule by mouth daily.    [provider]  traMADol (ULTRAM) 50 MG tablet Take 1 tablet (50 mg total) by mouth every 12 (twelve) hours as needed for severe pain. 05/25/16   Tomasita Crumble, MD      Allergies    Patient has no known allergies.    Review of Systems   Review of Systems Ten systems reviewed and are negative for acute change, except as noted in the HPI.    Physical Exam Updated Vital Signs BP 128/85    Pulse 94   Temp 98.3 F (36.8 C) (Oral)   Resp 18   SpO2 98%   Physical Exam Vitals and nursing note reviewed.  Constitutional:      General: She is not in acute distress.    Appearance: She is well-developed. She is not diaphoretic.     Comments: Obese AA female  HENT:     Head: Normocephalic and atraumatic.  Eyes:     General: No scleral icterus.    Conjunctiva/sclera: Conjunctivae normal.  Cardiovascular:     Rate and Rhythm: Normal rate and regular rhythm.     Pulses: Normal pulses.     Comments: DP pulse 2+ b/l Pulmonary:     Effort: Pulmonary effort is normal. No respiratory distress.     Comments: Respirations even and unlabored Musculoskeletal:     Cervical back: Normal range of motion.     Comments: Mild soft tissue swelling to the dorsum of the midfoot b/l. TTP noted without crepitus, deformity, erythema, heat to touch.  Skin:    General: Skin is warm and dry.     Coloration: Skin is not pale.     Findings: No erythema or rash.  Neurological:     Mental Status: She is alert and oriented to person, place, and time.     Coordination: Coordination normal.  Psychiatric:        Behavior:  Behavior normal.     ED Results / Procedures / Treatments   Labs (all labs ordered are listed, but only abnormal results are displayed) Labs Reviewed - No data to display  EKG None  Radiology No results found.  Procedures Procedures    Medications Ordered in ED Medications - No data to display  ED Course/ Medical Decision Making/ A&P                           Medical Decision Making Risk Prescription drug management.   This patient presents to the ED for concern of foot pain bilaterally, this involves an extensive number of treatment options, and is a complaint that carries with it a high risk of complications and morbidity.  The differential diagnosis includes stress fx vs arthritis vs PVD vs peripheral edema vs cellulitis   Co morbidities that complicate  the patient evaluation  Obesity    Medicines ordered and prescription drug management:  I ordered medication including prednisone for pain/management of inflammation  Reevaluation of the patient after these medicines showed that the patient stayed the same I have reviewed the patients home medicines and have made adjustments as needed   Test Considered:  Xray foot   Problem List / ED Course:  Post op shoe applied for comfort Given PO medications for pain   Reevaluation:  After the interventions noted above, I reevaluated the patient and found that they have :stayed the same   Social Determinants of Health:  Uninsured    Dispostion:  After consideration of the diagnostic results and the patients response to treatment, I feel that the patent would benefit from outpatient burst of prednisone as well as podiatry f/u; suspect DMICS brought on by patient's job. Given post op shoe for comfort. Return precautions discussed and provided. Patient discharged in stable condition with no unaddressed concerns.         Final Clinical Impression(s) / ED Diagnoses Final diagnoses:  Foot pain, bilateral    Rx / DC Orders ED Discharge Orders          Ordered    predniSONE (DELTASONE) 20 MG tablet  Daily        01/31/22 0101              Antony Madura, PA-C 01/31/22 0513    Gilda Crease, MD 02/08/22 [phone]
